# Patient Record
Sex: Male | Born: 1995 | Race: Black or African American | Hispanic: No | Marital: Single | Smoking: Light tobacco smoker
Health system: Southern US, Community
[De-identification: ages and names within clinical notes are randomized; demographics above are authoritative.]

---
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
## 2019-05-11 ENCOUNTER — Telehealth: Payer: Self-pay

## 2019-05-11 NOTE — Telephone Encounter (Signed)
Called patient to do their pre-visit COVID screening.  Call went to voicemail. Unable to do prescreening.  

## 2019-05-12 ENCOUNTER — Ambulatory Visit: Payer: 59 | Admitting: Internal Medicine

## 2019-07-20 ENCOUNTER — Other Ambulatory Visit: Payer: Self-pay

## 2019-07-20 ENCOUNTER — Emergency Department (HOSPITAL_BASED_OUTPATIENT_CLINIC_OR_DEPARTMENT_OTHER)
Admission: EM | Admit: 2019-07-20 | Discharge: 2019-07-20 | Disposition: A | Discharge status: Home / Self Care | Payer: Federal, State, Local not specified - PPO | Attending: Emergency Medicine | Admitting: Emergency Medicine

## 2019-07-20 ENCOUNTER — Encounter (HOSPITAL_BASED_OUTPATIENT_CLINIC_OR_DEPARTMENT_OTHER): Payer: Self-pay | Admitting: *Deleted

## 2019-07-20 DIAGNOSIS — R599 Enlarged lymph nodes, unspecified: Secondary | ICD-10-CM | POA: Insufficient documentation

## 2019-07-20 DIAGNOSIS — M542 Cervicalgia: Secondary | ICD-10-CM | POA: Diagnosis present

## 2019-07-20 DIAGNOSIS — R591 Generalized enlarged lymph nodes: Secondary | ICD-10-CM

## 2019-07-20 MED ORDER — IBUPROFEN 800 MG PO TABS
800.0000 mg | ORAL_TABLET | Freq: Once | ORAL | Status: AC
  Administered 2019-07-20: 800 mg via ORAL
  Filled 2019-07-20: qty 1

## 2019-07-20 NOTE — ED Provider Notes (Signed)
[place] EMERGENCY DEPARTMENT Provider Note   CSN: [id number] Arrival date & time: 07/20/19  1629     History Chief Complaint  Patient presents with  . Neck Pain    Travis Reid is a 24 y.o. male presenting for evaluation of right-sided neck pain and swelling.  Patient states the past 2 days, he has had a swollen spot on his right side neck.  It is a little bit tender and sore.  He denies symptoms on the left side.  No trauma or injury.  He denies fever, chills, nasal congestion, ear pain, sore throat, cough.  He has no medical problems, takes no medications daily.  He states his dad was diagnosed with cancer, and thus he is concerned about this swoolen spot.  HPI     History reviewed. No pertinent past medical history.  There are no problems to display for this patient.   History reviewed. No pertinent surgical history.     History reviewed. No pertinent family history.  Social History   Tobacco Use  . Smoking status: Never Smoker  . Smokeless tobacco: Never Used  Substance Use Topics  . Alcohol use: Not Currently  . Drug use: Not on file    Home Medications Prior to Admission medications   Not on File    Allergies    Patient has no known allergies.  Review of Systems   Review of Systems  Constitutional: Negative for fever.  Musculoskeletal: Positive for neck pain (Swollen spot on right side neck).    Physical Exam Updated Vital Signs BP 117/83 (BP Location: Right Arm)   Pulse (!) 59   Temp 98.1 F (36.7 C) (Oral)   Resp 16   Ht 5\' 11"  (1.803 m)   Wt 83.9 kg   SpO2 100%   BMI 25.80 kg/m   Physical Exam Vitals and nursing note reviewed.  Constitutional:      General: He is not in acute distress.    Appearance: He is well-developed.     Comments: Appears nontoxic  HENT:     Head: Normocephalic and atraumatic.     Comments: OP clear without tonsillar swelling or exudate.  Uvula midline with equal palate rise.  TMs nonerythematous  nonbulging bilaterally.  No dental tenderness or pain. Eyes:     Extraocular Movements: Extraocular movements intact.     Conjunctiva/sclera: Conjunctivae normal.     Pupils: Pupils are equal, round, and reactive to light.  Neck:     Comments: Cervical lymphadenopathy noted on the right side.  Mildly tender.  No erythema or warmth. Lesion is well defined and mobile.  No tenderness palpation of the midline C-spine.  Moving head without signs of stiffness or meningismus. Cardiovascular:     Rate and Rhythm: Normal rate and regular rhythm.     Pulses: Normal pulses.  Pulmonary:     Effort: Pulmonary effort is normal.     Breath sounds: Normal breath sounds.     Comments: Clear lung sounds in all fields Abdominal:     General: There is no distension.  Musculoskeletal:        General: Normal range of motion.     Cervical back: Normal range of motion and neck supple.  Lymphadenopathy:     Cervical: Cervical adenopathy present.  Skin:    General: Skin is warm.     Capillary Refill: Capillary refill takes less than 2 seconds.     Findings: No rash.  Neurological:     Mental  Status: He is alert and oriented to person, place, and time.     ED Results / Procedures / Treatments   Labs (all labs ordered are listed, but only abnormal results are displayed) Labs Reviewed - No data to display  EKG None  Radiology No results found.  Procedures Procedures (including critical care time)  Medications Ordered in ED Medications  ibuprofen (ADVIL) tablet 800 mg (800 mg Oral Given 07/20/19 1844)    ED Course  I have reviewed the triage vital signs and the nursing notes.  Pertinent labs & imaging results that were available during my care of the patient were reviewed by me and considered in my medical decision making (see chart for details).    MDM Rules/Calculators/A&P                          Presenting for evaluation of right-sided neck swelling and tenderness.  On exam, patient  appears nontoxic.  Exam is consistent with cervical lymphadenopathy.  Likely reactive, slightly tender and has been present for 2 days.  Discussed with patient.  Discussed less likely to be cancer in the setting of new onset and tenderness.  Encouraged symptomatic treatment Tylenol and ibuprofen, and follow-up with PCP if not improving in the next 2 weeks.  At this time, patient appears safe for discharge.  Return precautions given.  Patient states he understands and agrees plan.  Final Clinical Impression(s) / ED Diagnoses Final diagnoses:  Lymphadenopathy    Rx / DC Orders ED Discharge Orders    None       Franchot Heidelberg, PA-C 07/20/19 Dena Billet, MD 07/20/19 [phone]

## 2019-07-20 NOTE — ED Triage Notes (Signed)
Pt c/o right neck swelling x 2 days

## 2019-07-20 NOTE — Discharge Instructions (Signed)
Take ibuprofen 3 times a day with meals.  Do not take other anti-inflammatories at the same time (Advil, Motrin, naproxen, Aleve). You may supplement with Tylenol if you need further pain control. Follow-up with your primary care doctor for recheck in 2 weeks if it is still swollen.  There is information in your paperwork about places you can follow-up at. If needed, they can order further workup.  Return to the emergency room if you develop difficulty swallowing, difficulty breathing, any new, worsening, or concerning symptoms.

## 2019-08-22 ENCOUNTER — Other Ambulatory Visit: Payer: Self-pay

## 2019-08-23 ENCOUNTER — Ambulatory Visit: Payer: Federal, State, Local not specified - PPO | Admitting: Family Medicine

## 2019-08-23 ENCOUNTER — Encounter: Payer: Self-pay | Admitting: Family Medicine

## 2019-08-23 VITALS — BP 118/70 | HR 75 | Temp 97.3°F | Ht 72.0 in | Wt 188.6 lb

## 2019-08-23 DIAGNOSIS — D239 Other benign neoplasm of skin, unspecified: Secondary | ICD-10-CM

## 2019-08-23 DIAGNOSIS — Z0000 Encounter for general adult medical examination without abnormal findings: Secondary | ICD-10-CM | POA: Diagnosis not present

## 2019-08-23 DIAGNOSIS — D249 Benign neoplasm of unspecified breast: Secondary | ICD-10-CM | POA: Insufficient documentation

## 2019-08-23 DIAGNOSIS — L819 Disorder of pigmentation, unspecified: Secondary | ICD-10-CM

## 2019-08-23 LAB — COMPREHENSIVE METABOLIC PANEL
ALT: 26 U/L (ref 0–53)
AST: 37 U/L (ref 0–37)
Albumin: 4.7 g/dL (ref 3.5–5.2)
Alkaline Phosphatase: 52 U/L (ref 39–117)
BUN: 19 mg/dL (ref 6–23)
CO2: 30 mEq/L (ref 19–32)
Calcium: 9.8 mg/dL (ref 8.4–10.5)
Chloride: 100 mEq/L (ref 96–112)
Creatinine, Ser: 1.11 mg/dL (ref 0.40–1.50)
GFR: 97.51 mL/min (ref >60.00)
Glucose, Bld: 95 mg/dL (ref 70–99)
Potassium: 4.3 mEq/L (ref 3.5–5.1)
Sodium: 137 mEq/L (ref 135–145)
Total Bilirubin: 0.6 mg/dL (ref 0.2–1.2)
Total Protein: 6.7 g/dL (ref 6.0–8.3)

## 2019-08-23 LAB — CBC
HCT: 42.5 % (ref 39.0–52.0)
Hemoglobin: 14 g/dL (ref 13.0–17.0)
MCHC: 32.9 g/dL (ref 30.0–36.0)
MCV: 90 fl (ref 78.0–100.0)
Platelets: 181 10*3/uL (ref 150.0–400.0)
RBC: 4.73 Mil/uL (ref 4.22–5.81)
RDW: 13.3 % (ref 11.5–15.5)
WBC: 3.9 10*3/uL — ABNORMAL LOW (ref 4.0–10.5)

## 2019-08-23 LAB — LDL CHOLESTEROL, DIRECT: Direct LDL: 68 mg/dL

## 2019-08-23 NOTE — Progress Notes (Addendum)
New Patient Office Visit  Subjective:  Patient ID: Travis Reid, male    DOB: 25-Oct-1995  Age: 24 y.o. MRN: [id number]  CC:  Chief Complaint  Patient presents with  . Establish Care    New patient, no concerns patient states that he had swollen lymph nodes a few weeks ago but that have gone down.     HPI Travis Reid presents for establishment of care complete physical exam and follow-up of recent lymphadenopathy.  Was seen in the ER 3 to 4 weeks ago son right posterior lymphadenopathy.  Seem to be associated with either a pustule or an insect bite in his scalp.  These have both resolved completely.  Denied fatigue sore throat or headache.  Is very healthy.  Not currently drinking alcohol quit smoking yesterday.  He has been smoking marijuana on a daily basis.  He is a busy Forensic psychologist.  Lives with a roommate.  Mom is relatively healthy in her mid 53s Father is recovering from throat cancer.  History reviewed. No pertinent past medical history.  History reviewed. No pertinent surgical history.  Family History  Problem Relation Age of Onset  . Healthy Mother   . Cancer Father     Social History   Socioeconomic History  . Marital status: Single    Spouse name: Not on file  . Number of children: Not on file  . Years of education: Not on file  . Highest education level: Not on file  Occupational History  . Not on file  Tobacco Use  . Smoking status: Light Tobacco Smoker    Types: Cigarettes  . Smokeless tobacco: Never Used  Vaping Use  . Vaping Use: Never used  Substance and Sexual Activity  . Alcohol use: Not Currently  . Drug use: Yes    Types: Marijuana  . Sexual activity: Yes  Other Topics Concern  . Not on file  Social History Narrative  . Not on file   Social Determinants of Health   Financial Resource Strain:   . Difficulty of Paying Living Expenses: Not on file  Food Insecurity:   . Worried About Charity fundraiser in the Last Year: Not on file  .  Ran Out of Food in the Last Year: Not on file  Transportation Needs:   . Lack of Transportation (Medical): Not on file  . Lack of Transportation (Non-Medical): Not on file  Physical Activity:   . Days of Exercise per Week: Not on file  . Minutes of Exercise per Session: Not on file  Stress:   . Feeling of Stress : Not on file  Social Connections:   . Frequency of Communication with Friends and Family: Not on file  . Frequency of Social Gatherings with Friends and Family: Not on file  . Attends Religious Services: Not on file  . Active Member of Clubs or Organizations: Not on file  . Attends Archivist Meetings: Not on file  . Marital Status: Not on file  Intimate Partner Violence:   . Fear of Current or Ex-Partner: Not on file  . Emotionally Abused: Not on file  . Physically Abused: Not on file  . Sexually Abused: Not on file    ROS Review of Systems  Constitutional: Negative for chills, diaphoresis, fatigue, fever and unexpected weight change.  HENT: Negative for congestion, postnasal drip and sore throat.   Eyes: Negative for photophobia and visual disturbance.  Respiratory: Negative.   Cardiovascular: Negative.   Gastrointestinal: Negative.  Endocrine: Negative for polyphagia and polyuria.  Genitourinary: Negative.   Musculoskeletal: Negative for gait problem and joint swelling.  Skin: Negative for pallor and rash.  Allergic/Immunologic: Negative for immunocompromised state.  Neurological: Negative for weakness, light-headedness, numbness and headaches.  Hematological: Does not bruise/bleed easily.  Psychiatric/Behavioral: Negative.    Depression screen [facility] 2/9 08/23/2019 08/23/2019  Decreased Interest 0 0  Down, Depressed, Hopeless 0 0  PHQ - 2 Score 0 0  Altered sleeping 0 -  Tired, decreased energy 1 -  Change in appetite 1 -  Feeling bad or failure about yourself  0 -  Trouble concentrating 1 -  Moving slowly or fidgety/restless 0 -  Suicidal thoughts  0 -  PHQ-9 Score 3 -  Difficult doing work/chores Somewhat difficult -    Objective:   Today's Vitals: BP 118/70   Pulse 75   Temp (!) 97.3 F (36.3 C) (Tympanic)   Ht 6' (1.829 m)   Wt 188 lb 9.6 oz (85.5 kg)   SpO2 97%   BMI 25.58 kg/m   Physical Exam Vitals and nursing note reviewed.  Constitutional:      General: He is not in acute distress.    Appearance: Normal appearance. He is normal weight. He is not ill-appearing, toxic-appearing or diaphoretic.  HENT:     Head: Normocephalic and atraumatic.     Right Ear: Tympanic membrane, ear canal and external ear normal.     Left Ear: Tympanic membrane, ear canal and external ear normal.     Nose: Nose normal. No congestion or rhinorrhea.     Mouth/Throat:     Mouth: Mucous membranes are dry.     Pharynx: Oropharynx is clear. No oropharyngeal exudate or posterior oropharyngeal erythema.  Eyes:     General: No scleral icterus.       Right eye: No discharge.     Extraocular Movements: Extraocular movements intact.     Conjunctiva/sclera: Conjunctivae normal.     Pupils: Pupils are equal, round, and reactive to light.  Cardiovascular:     Rate and Rhythm: Normal rate and regular rhythm.     Pulses: Normal pulses.     Heart sounds: Normal heart sounds.  Pulmonary:     Effort: Pulmonary effort is normal.     Breath sounds: Normal breath sounds.  Abdominal:     General: Abdomen is flat. Bowel sounds are normal. There is no distension.     Palpations: Abdomen is soft. There is no mass.     Tenderness: There is no abdominal tenderness. There is no guarding or rebound.     Hernia: No hernia is present. There is no hernia in the left inguinal area or right inguinal area.  Genitourinary:    Penis: No hypospadias, erythema, tenderness, discharge, swelling or lesions.      Testes:        Right: Mass, tenderness or swelling not present. Right testis is descended.        Left: Mass, tenderness or swelling not present. Left testis  is descended.     Epididymis:     Right: Not inflamed or enlarged.     Left: Not inflamed or enlarged.  Musculoskeletal:     Cervical back: Normal range of motion and neck supple. No rigidity or tenderness.     Right lower leg: No edema.     Left lower leg: No edema.  Lymphadenopathy:     Cervical: No cervical adenopathy.     Lower Body: No  right inguinal adenopathy. No left inguinal adenopathy.  Skin:    General: Skin is warm and dry.  Neurological:     Mental Status: He is alert and oriented to person, place, and time.  Psychiatric:        Mood and Affect: Mood normal.        Behavior: Behavior normal.     Assessment & Plan:   Problem List Items Addressed This Visit      Musculoskeletal and Integument   Dermatofibroma   Atypical pigmented skin lesion   Relevant Orders   Ambulatory referral to Dermatology     Other   Healthcare maintenance - Primary   Relevant Orders   CBC (Completed)   Comprehensive metabolic panel (Completed)   LDL cholesterol, direct (Completed)   HIV Antibody (routine testing w rflx) (Completed)   Hepatitis C antibody (Completed)   Urinalysis, Routine w reflex microscopic (Completed)      No outpatient encounter medications on file as of 08/23/2019.   No facility-administered encounter medications on file as of 08/23/2019.    Follow-up: Return in about 1 year (around 08/22/2020), or if symptoms worsen or fail to improve.  Given information on health maintenance and disease prevention.  Discussed marijuana use and it may holding the back in his body and career.  Advised him to have the Covid vaccine. Libby Maw, MD

## 2019-08-23 NOTE — Patient Instructions (Signed)
Health Maintenance, Male Adopting a healthy lifestyle and getting preventive care are important in promoting health and wellness. Ask your health care provider about:  The right schedule for you to have regular tests and exams.  Things you can do on your own to prevent diseases and keep yourself healthy. What should I know about diet, weight, and exercise? Eat a healthy diet   Eat a diet that includes plenty of vegetables, fruits, low-fat dairy products, and lean protein.  Do not eat a lot of foods that are high in solid fats, added sugars, or sodium. Maintain a healthy weight Body mass index (BMI) is a measurement that can be used to identify possible weight problems. It estimates body fat based on height and weight. Your health care provider can help determine your BMI and help you achieve or maintain a healthy weight. Get regular exercise Get regular exercise. This is one of the most important things you can do for your health. Most adults should:  Exercise for at least 150 minutes each week. The exercise should increase your heart rate and make you sweat (moderate-intensity exercise).  Do strengthening exercises at least twice a week. This is in addition to the moderate-intensity exercise.  Spend less time sitting. Even light physical activity can be beneficial. Watch cholesterol and blood lipids Have your blood tested for lipids and cholesterol at 25 years of age, then have this test every 5 years. You may need to have your cholesterol levels checked more often if:  Your lipid or cholesterol levels are high.  You are older than 25 years of age.  You are at high risk for heart disease. What should I know about cancer screening? Many types of cancers can be detected early and may often be prevented. Depending on your health history and family history, you may need to have cancer screening at various ages. This may include screening for:  Colorectal cancer.  Prostate  cancer.  Skin cancer.  Lung cancer. What should I know about heart disease, diabetes, and high blood pressure? Blood pressure and heart disease  High blood pressure causes heart disease and increases the risk of stroke. This is more likely to develop in people who have high blood pressure readings, are of African descent, or are overweight.  Talk with your health care provider about your target blood pressure readings.  Have your blood pressure checked: ? Every 3-5 years if you are 18-39 years of age. ? Every year if you are 40 years old or older.  If you are between the ages of 65 and 75 and are a current or former smoker, ask your health care provider if you should have a one-time screening for abdominal aortic aneurysm (AAA). Diabetes Have regular diabetes screenings. This checks your fasting blood sugar level. Have the screening done:  Once every three years after age 45 if you are at a normal weight and have a low risk for diabetes.  More often and at a younger age if you are overweight or have a high risk for diabetes. What should I know about preventing infection? Hepatitis B If you have a higher risk for hepatitis B, you should be screened for this virus. Talk with your health care provider to find out if you are at risk for hepatitis B infection. Hepatitis C Blood testing is recommended for:  Everyone born from 1945 through 1965.  Anyone with known risk factors for hepatitis C. Sexually transmitted infections (STIs)  You should be screened each year   for STIs, including gonorrhea and chlamydia, if: ? You are sexually active and are younger than 24 years of age. ? You are older than 24 years of age and your health care provider tells you that you are at risk for this type of infection. ? Your sexual activity has changed since you were last screened, and you are at increased risk for chlamydia or gonorrhea. Ask your health care provider if you are at risk.  Ask your  health care provider about whether you are at high risk for HIV. Your health care provider may recommend a prescription medicine to help prevent HIV infection. If you choose to take medicine to prevent HIV, you should first get tested for HIV. You should then be tested every 3 months for as long as you are taking the medicine. Follow these instructions at home: Lifestyle  Do not use any products that contain nicotine or tobacco, such as cigarettes, e-cigarettes, and chewing tobacco. If you need help quitting, ask your health care provider.  Do not use street drugs.  Do not share needles.  Ask your health care provider for help if you need support or information about quitting drugs. Alcohol use  Do not drink alcohol if your health care provider tells you not to drink.  If you drink alcohol: ? Limit how much you have to 0-2 drinks a day. ? Be aware of how much alcohol is in your drink. In the [place] one drink equals one 12 oz bottle of beer (355 mL), one 5 oz glass of wine (148 mL), or one 1 oz glass of hard liquor (44 mL). General instructions  Schedule regular health, dental, and eye exams.  Stay current with your vaccines.  Tell your health care provider if: ? You often feel depressed. ? You have ever been abused or do not feel safe at home. Summary  Adopting a healthy lifestyle and getting preventive care are important in promoting health and wellness.  Follow your health care provider's instructions about healthy diet, exercising, and getting tested or screened for diseases.  Follow your health care provider's instructions on monitoring your cholesterol and blood pressure. This information is not intended to replace advice given to you by your health care provider. Make sure you discuss any questions you have with your health care provider. Document Revised: 12/16/2017 Document Reviewed: 12/16/2017 Elsevier Patient Education  2020 [employer]  Preventive Care 21-39 Years  Old, Male Preventive care refers to lifestyle choices and visits with your health care provider that can promote health and wellness. This includes:  A yearly physical exam. This is also called an annual well check.  Regular dental and eye exams.  Immunizations.  Screening for certain conditions.  Healthy lifestyle choices, such as eating a healthy diet, getting regular exercise, not using drugs or products that contain nicotine and tobacco, and limiting alcohol use. What can I expect for my preventive care visit? Physical exam Your health care provider will check:  Height and weight. These may be used to calculate body mass index (BMI), which is a measurement that tells if you are at a healthy weight.  Heart rate and blood pressure.  Your skin for abnormal spots. Counseling Your health care provider may ask you questions about:  Alcohol, tobacco, and drug use.  Emotional well-being.  Home and relationship well-being.  Sexual activity.  Eating habits.  Work and work environment. What immunizations do I need?  Influenza (flu) vaccine  This is recommended every year. Tetanus, diphtheria,   and pertussis (Tdap) vaccine  You may need a Td booster every 10 years. Varicella (chickenpox) vaccine  You may need this vaccine if you have not already been vaccinated. Human papillomavirus (HPV) vaccine  If recommended by your health care provider, you may need three doses over 6 months. Measles, mumps, and rubella (MMR) vaccine  You may need at least one dose of MMR. You may also need a second dose. Meningococcal conjugate (MenACWY) vaccine  One dose is recommended if you are 73-28 years old and a Market researcher living in a residence hall, or if you have one of several medical conditions. You may also need additional booster doses. Pneumococcal conjugate (PCV13) vaccine  You may need this if you have certain conditions and were not previously  vaccinated. Pneumococcal polysaccharide (PPSV23) vaccine  You may need one or two doses if you smoke cigarettes or if you have certain conditions. Hepatitis A vaccine  You may need this if you have certain conditions or if you travel or work in places where you may be exposed to hepatitis A. Hepatitis B vaccine  You may need this if you have certain conditions or if you travel or work in places where you may be exposed to hepatitis B. Haemophilus influenzae type b (Hib) vaccine  You may need this if you have certain risk factors. You may receive vaccines as individual doses or as more than one vaccine together in one shot (combination vaccines). Talk with your health care provider about the risks and benefits of combination vaccines. What tests do I need? Blood tests  Lipid and cholesterol levels. These may be checked every 5 years starting at age 80.  Hepatitis C test.  Hepatitis B test. Screening   Diabetes screening. This is done by checking your blood sugar (glucose) after you have not eaten for a while (fasting).  Sexually transmitted disease (STD) testing. Talk with your health care provider about your test results, treatment options, and if necessary, the need for more tests. Follow these instructions at home: Eating and drinking   Eat a diet that includes fresh fruits and vegetables, whole grains, lean protein, and low-fat dairy products.  Take vitamin and mineral supplements as recommended by your health care provider.  Do not drink alcohol if your health care provider tells you not to drink.  If you drink alcohol: ? Limit how much you have to 0-2 drinks a day. ? Be aware of how much alcohol is in your drink. In the [place] one drink equals one 12 oz bottle of beer (355 mL), one 5 oz glass of wine (148 mL), or one 1 oz glass of hard liquor (44 mL). Lifestyle  Take daily care of your teeth and gums.  Stay active. Exercise for at least 30 minutes on 5 or more days  each week.  Do not use any products that contain nicotine or tobacco, such as cigarettes, e-cigarettes, and chewing tobacco. If you need help quitting, ask your health care provider.  If you are sexually active, practice safe sex. Use a condom or other form of protection to prevent STIs (sexually transmitted infections). What's next?  Go to your health care provider once a year for a well check visit.  Ask your health care provider how often you should have your eyes and teeth checked.  Stay up to date on all vaccines. This information is not intended to replace advice given to you by your health care provider. Make sure you discuss any questions you  have with your health care provider. Document Revised: 12/17/2017 Document Reviewed: 12/17/2017 Elsevier Patient Education  2020 Elsevier Inc.  

## 2019-08-24 LAB — URINALYSIS, ROUTINE W REFLEX MICROSCOPIC
Bilirubin Urine: NEGATIVE
Hgb urine dipstick: NEGATIVE
Ketones, ur: NEGATIVE
Leukocytes,Ua: NEGATIVE
Nitrite: NEGATIVE
RBC / HPF: NONE SEEN (ref 0–?)
Specific Gravity, Urine: 1.015 (ref 1.000–1.030)
Total Protein, Urine: NEGATIVE
Urine Glucose: NEGATIVE
Urobilinogen, UA: 0.2 (ref 0.0–1.0)
pH: 6 (ref 5.0–8.0)

## 2019-08-24 LAB — HIV ANTIBODY (ROUTINE TESTING W REFLEX): HIV 1&2 Ab, 4th Generation: NONREACTIVE

## 2019-08-24 LAB — HEPATITIS C ANTIBODY
Hepatitis C Ab: NONREACTIVE
SIGNAL TO CUT-OFF: 0.01 (ref <1.00)

## 2019-08-25 DIAGNOSIS — L819 Disorder of pigmentation, unspecified: Secondary | ICD-10-CM | POA: Insufficient documentation

## 2019-08-25 DIAGNOSIS — D239 Other benign neoplasm of skin, unspecified: Secondary | ICD-10-CM | POA: Insufficient documentation

## 2019-08-25 NOTE — Addendum Note (Signed)
Addended by: Jon Billings on: 08/25/2019 07:34 AM   Modules accepted: Orders

## 2020-08-22 ENCOUNTER — Encounter: Payer: Federal, State, Local not specified - PPO | Admitting: Family Medicine
